# Patient Record
Sex: Female | Born: 1970 | Race: White | Hispanic: No | Marital: Married | Smoking: Current every day smoker
Health system: Southern US, Community
[De-identification: ages and names within clinical notes are randomized; demographics above are authoritative.]

## PROBLEM LIST (undated history)

## (undated) DIAGNOSIS — R011 Cardiac murmur, unspecified: Secondary | ICD-10-CM

## (undated) DIAGNOSIS — J45909 Unspecified asthma, uncomplicated: Secondary | ICD-10-CM

## (undated) DIAGNOSIS — J449 Chronic obstructive pulmonary disease, unspecified: Secondary | ICD-10-CM

## (undated) DIAGNOSIS — F419 Anxiety disorder, unspecified: Secondary | ICD-10-CM

## (undated) DIAGNOSIS — T783XXA Angioneurotic edema, initial encounter: Secondary | ICD-10-CM

## (undated) HISTORY — DX: Unspecified asthma, uncomplicated: J45.909

## (undated) HISTORY — DX: Anxiety disorder, unspecified: F41.9

## (undated) HISTORY — DX: Cardiac murmur, unspecified: R01.1

## (undated) HISTORY — PX: TONSILLECTOMY: SUR1361

## (undated) HISTORY — DX: Angioneurotic edema, initial encounter: T78.3XXA

## (undated) HISTORY — PX: SINOSCOPY: SHX187

## (undated) HISTORY — DX: Chronic obstructive pulmonary disease, unspecified: J44.9

---
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
## 1993-12-02 HISTORY — PX: TUBAL LIGATION: SHX77

## 2001-12-02 HISTORY — PX: ADENOIDECTOMY: SUR15

## 2004-06-13 ENCOUNTER — Emergency Department (HOSPITAL_COMMUNITY): Admission: EM | Admit: 2004-06-13 | Discharge: 2004-06-13 | Payer: Self-pay | Admitting: Emergency Medicine
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
## 2008-12-02 HISTORY — PX: BUNIONECTOMY: SHX129

## 2009-12-02 HISTORY — PX: VAGINAL HYSTERECTOMY: SHX2639

## 2013-12-02 HISTORY — PX: SHOULDER SURGERY: SHX246

## 2013-12-09 DIAGNOSIS — M25319 Other instability, unspecified shoulder: Secondary | ICD-10-CM | POA: Insufficient documentation

## 2014-02-28 DIAGNOSIS — S43439A Superior glenoid labrum lesion of unspecified shoulder, initial encounter: Secondary | ICD-10-CM | POA: Insufficient documentation

## 2014-04-28 DIAGNOSIS — M7520 Bicipital tendinitis, unspecified shoulder: Secondary | ICD-10-CM | POA: Insufficient documentation

## 2014-10-17 DIAGNOSIS — R200 Anesthesia of skin: Secondary | ICD-10-CM | POA: Insufficient documentation

## 2014-10-17 DIAGNOSIS — R42 Dizziness and giddiness: Secondary | ICD-10-CM | POA: Insufficient documentation

## 2014-10-17 DIAGNOSIS — M47812 Spondylosis without myelopathy or radiculopathy, cervical region: Secondary | ICD-10-CM | POA: Insufficient documentation

## 2014-11-01 DIAGNOSIS — G43909 Migraine, unspecified, not intractable, without status migrainosus: Secondary | ICD-10-CM | POA: Insufficient documentation

## 2014-11-01 DIAGNOSIS — R202 Paresthesia of skin: Secondary | ICD-10-CM | POA: Insufficient documentation

## 2014-11-01 DIAGNOSIS — F419 Anxiety disorder, unspecified: Secondary | ICD-10-CM | POA: Insufficient documentation

## 2014-11-15 DIAGNOSIS — R799 Abnormal finding of blood chemistry, unspecified: Secondary | ICD-10-CM | POA: Insufficient documentation

## 2014-11-15 DIAGNOSIS — M542 Cervicalgia: Secondary | ICD-10-CM | POA: Insufficient documentation

## 2016-07-16 ENCOUNTER — Ambulatory Visit (INDEPENDENT_AMBULATORY_CARE_PROVIDER_SITE_OTHER): Payer: Medicare Other | Admitting: Allergy

## 2016-07-16 ENCOUNTER — Encounter: Payer: Self-pay | Admitting: Allergy

## 2016-07-16 VITALS — BP 110/70 | HR 100 | Temp 98.2°F | Resp 18 | Ht 59.0 in | Wt 102.8 lb

## 2016-07-16 DIAGNOSIS — T783XXA Angioneurotic edema, initial encounter: Secondary | ICD-10-CM | POA: Diagnosis not present

## 2016-07-16 DIAGNOSIS — Z889 Allergy status to unspecified drugs, medicaments and biological substances status: Secondary | ICD-10-CM | POA: Diagnosis not present

## 2016-07-16 DIAGNOSIS — K209 Esophagitis, unspecified without bleeding: Secondary | ICD-10-CM | POA: Insufficient documentation

## 2016-07-16 DIAGNOSIS — R0602 Shortness of breath: Secondary | ICD-10-CM

## 2016-07-16 DIAGNOSIS — J439 Emphysema, unspecified: Secondary | ICD-10-CM | POA: Insufficient documentation

## 2016-07-16 NOTE — Patient Instructions (Addendum)
1. Angioedema, initial encounter Recurrent episodes of swelling with shortness of breath and rash concerning for allergic reaction.  Recent reaction followed after sucrolose ingestion of which you report an allergy to.  We do not have skin testing for this and thus would avoid as you have been doing previously.   Given recurrent episodes of allergic type reaction concern for possible mast cell activation.   Obtain following labs: CBC w diff, CMP, Tryptase, C4 level, environmental aeroallergen panel with total IgE   Continue taking Zantac 150mg  twice a day and would recommend starting Allegra 180mg  daily.   Pending lab results, may benefit from trial of cromolyn for abdominal symptoms.    2. Shortness of breath Occurs during reaction as above.  Continue avoidance of drugs/ingredients you are allergic too Use Proair as needed and monitor frequency of use.   3. Drug allergy Continue avoidance for now of medications you are allergic to. May be candidate in the future for drug allergy testing/challenges.   4. Esophagitis Concerned for possible eosinophilic esophagitis given long-standing history of choking and feeling of food stuck in throat.  Will assess eosinophil level with CBC as above.  Follow-up with GI for upper endoscopy  Follow-up 2mo

## 2016-07-16 NOTE — Progress Notes (Signed)
New Patient Note  RE: Sara Everett MRN: [id number] DOB: [birth year] Date of Office Visit: 07/16/2016  Referring provider: Marcellus Scott, MD Primary care provider: Mikael Spray, NP  Chief Complaint: swelling, SOB, flushing  History of present illness: Sara Everett is a 45 y.o. female presenting today for consultation for swelling, SOB, flushing following sucrolose ingestion.    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  Afterwards she noted sucrolose was main ingredient.  She states she went to sleep and her husband woke her up in the night because  her face, abdomen, feet, hands were swollen.  She took a benadryl and she went to [facility].  She reports she was also having SOB.  At the hospital she reports she was given prendisone and dicyclomine.   Several days later she went to visit her grandchild who was in [facility] hospital however while walking into the hospital she felt like her swelling was worsening and was having SOB again and felt like she couldn't walk. She reports the ED there told her she might have been reacting to the presnidone or dicyclomin thus now she list these as allergies.  She received benadryl there.  She then saw her PCP who prescribed hydroxzyine to help with both antihistamine effect and to help with her anxiety and she was also advised to take Zantac.  She reports the swelling took about a week to fully resolve.   She reports abdominal swelling after she eats and has occasional cramping.  She had an episode 4 years ago when she developed rash, swelling and SOB after visiting zoo.  She also reports choking with foods and sensation of food being stuck in throat for past 20years.   She recently had a colonoscopy and had one polyp removed.  She reports having hot flashes but states she is going thru menopause.   She does not note episodic flushing and neither does husband.  Denies headaches.  Does report episodic  rash described as cluster of red bumps that are itchy; she currently reports she has rash on chest.  She also reports occasional syncopal episodes. And she has episodes where she reports she doesn't feel well and will have a tingling sensation through her body.    She does has panic attacks and feels this could be causing her SOB when she has a reaction.   Has been stung by venomous insect years ago with no systemic reactions.    She is a smoker and has been diagnosed with COPD years ago and follows with Dr. Blenda Nicely.   She does have a proair which she uses when she has illness otherwise reports use if very infrequent.    She reports she was a patient of ours and saw [place] in our office about 10 years ago and had allergy testing and she reports she was only positive to cockroach.    She list a variety of drug allergies as below.   Review of systems: Review of Systems  Constitutional: Negative for fever.  HENT: Negative for sore throat.   Eyes: Negative for redness.  Respiratory: Negative for cough and wheezing.   Cardiovascular: Positive for palpitations.  Gastrointestinal: Positive for abdominal pain.  Skin: Positive for rash.  Neurological: Negative for headaches.    All other systems negative unless noted above in HPI  Past medical history: Past Medical History:  Diagnosis Date  . Angio-edema   . Anxiety disorder   .  Asthma   . COPD (chronic obstructive pulmonary disease) (HCC)   . Heart murmur 1973    Past surgical history: Past Surgical History:  Procedure Laterality Date  . ADENOIDECTOMY  2003  . BUNIONECTOMY Left 2010  . SHOULDER SURGERY  2015  . SINOSCOPY  2011,2010,2010   deviated septum  . TONSILLECTOMY    . TUBAL LIGATION  1995  . VAGINAL HYSTERECTOMY  2011    Family history:  Family History  Problem Relation Age of Onset  . Cancer Mother   . Cancer Father   . Leukemia Sister   . High blood pressure Brother   . Gout Brother   . Breast cancer Paternal  Grandmother   . Melanoma Sister   . Diabetes Sister   . High blood pressure Sister   . Seizures Sister   No family history of anaphylaxis, angioedema   Social history: Social History   Social History  . Marital status: Married    Social History Main Topics  . Smoking status: Current Every Day Smoker    Packs/day: 1.00    Years: 29.00    Types: Cigarettes  . Smokeless tobacco: Never Used  . Alcohol use No  . Drug use: No    Social History Narrative  . Lives with husband in house with carpeting.   Cats inside home.  Cats and dog outside and is currently caring for a racoon.  Not-employed.     Medication List:   Medication List       Accurate as of 07/16/16  4:37 PM. Always use your most recent med list.          DULoxetine 30 MG capsule Commonly known as:  CYMBALTA Take 30 mg by mouth daily.   EPIPEN 2-PAK 0.3 mg/0.3 mL Soaj injection Generic drug:  EPINEPHrine Inject into the muscle once.   fluticasone 50 MCG/ACT nasal spray Commonly known as:  FLONASE Place 1 spray into both nostrils daily.   hydrOXYzine 25 MG tablet Commonly known as:  ATARAX/VISTARIL Take 25 mg by mouth 3 (three) times daily as needed.   PROAIR HFA 108 (90 Base) MCG/ACT inhaler Generic drug:  albuterol Inhale 2 puffs into the lungs every 4 (four) hours as needed for wheezing or shortness of breath.       Known medication allergies: Allergies  Allergen Reactions  . Antiseptic Mouth Rinse Anaphylaxis and Other (See Comments)    Fluid retention  . Penicillins Itching and Anaphylaxis    Make throat feel like it's closing  . Povidone-Iodine Swelling  . Aspirin Other (See Comments)    Unsure  . Dicyclomine Swelling    Stomach swelling  . Fluoxetine Other (See Comments)  . Gabapentin Other (See Comments)    "makes me crazy"  . Other Swelling    Artificial sweeteners. Also reports rash  . Prednisone Swelling    Stomach swelling  . Promethazine Other (See Comments)    "drawn up  leg cramps"  . Sertraline Other (See Comments)  . Sulfur   . Varenicline Other (See Comments)  . Cefaclor Rash  . Codeine Rash  . Erythromycin Rash  . Erythromycin Base Rash    itching  . Naproxen Sodium Rash  . Paroxetine Hcl Rash  . Sulfamethoxazole Rash     Physical examination: Blood pressure 110/70, pulse 100, temperature 98.2 F (36.8 C), temperature source Oral, resp. rate 18, height 4\' 11"  (1.499 m), weight 102 lb 12.8 oz (46.6 kg).  General: Alert, interactive, in no acute distress.  HEENT: TMs pearly gray, turbinates minimally edematous without discharge, post-pharynx non erythematous. Neck: Supple without lymphadenopathy. Lungs: Clear to auscultation without wheezing, rhonchi or rales. {no increased work of breathing. CV: Normal S1, S2 without murmurs. Abdomen: Nondistended, nontender. Skin: several erythematous blanchable macules over chest.  numerous hypopigmented macules over chest, arms.  many tattooes. Extremities:  No clubbing, cyanosis or edema. Neuro:   Grossly intact.  Diagnositics/Labs: Reviewed OV from NP McRae dated 06/17/16 which start she was given epipen[facility] prednisone and [facility]16 for facial swelling.  She returned 2 days later for same facial swelling and was given doxycycline.  She was then [facility] the following [facility] and given dexamethasone and hydroxyzine.  Follow-up with NP McRae with plan to [facility] and referred to allergy.    Assessment and plan:   1. Angioedema, initial encounter Recurrent episodes of swelling with shortness of breath and rash concerning for allergic-type reaction.  Recent reaction followed after sucrolose ingestion of which she reports an allergy to.  We do not have availability to perform skin or IgE testing to sucrolose. She should continue avoidance.   Given recurrent episodes of allergic-type reaction concerned for possible mast cell activation or systemic mastocytosis and  will begin evaluation as follows: CBC w diff, CMP, Tryptase, C4 level, environmental aeroallergen panel with total IgE   Continue taking Zantac 150mg  twice a day and would recommend starting Allegra 180mg  daily.  If she continue to have these recurrent reactions would increase to Allegra BID.   She has Epipen already.     Pending lab results, may benefit from trial of cromolyn for her abdominal symptoms.    2. Shortness of breath Reports occurs only during reactions as above.  Continue avoidance of drugs/ingredients you are allergic to.   Use Proair as needed and monitor frequency of use.  Continue follow-up with Dr. Blenda Nicelyhodri in pulmonology  3. Drug allergy Continue avoidance for now of medications you are allergic to. May be candidate in the future for drug allergy testing/challenges.   4. Esophagitis Concerned for possible eosinophilic esophagitis given long-standing history of choking and feeling of food stuck in throat.  Will assess eosinophil level with CBC as above.  Follow-up with GI and recommend upper endoscopy with biopsies to assess for EoE.  Follow-up 74mo  I appreciate the opportunity to take part in Sherilee's care. Please do not hesitate to contact me with questions.  Sincerely,   Margo AyeShaylar Denisa Enterline, MD Allergy/Immunology [facility]

## 2016-07-19 LAB — ALLERGENS W/TOTAL IGE AREA 2
Alternaria Alternata IgE: <0.1 kU/L
Aspergillus Fumigatus IgE: <0.1 kU/L
Bermuda Grass IgE: <0.1 kU/L
Cedar, Mountain IgE: <0.1 kU/L
Common Silver Birch IgE: <0.1 kU/L
D Farinae IgE: <0.1 kU/L
D Pteronyssinus IgE: <0.1 kU/L
IGE (IMMUNOGLOBULIN E), SERUM: 14 [IU]/mL (ref 0–100)
Maple/Box Elder IgE: <0.1 kU/L
Mouse Urine IgE: <0.1 kU/L
Oak, White IgE: <0.1 kU/L
Pigweed, Rough IgE: <0.1 kU/L
Ragweed, Short IgE: <0.1 kU/L
Sheep Sorrel IgE Qn: <0.1 kU/L

## 2016-07-19 LAB — COMPREHENSIVE METABOLIC PANEL
ALBUMIN: 4.5 g/dL (ref 3.5–5.5)
ALK PHOS: 77 IU/L (ref 39–117)
ALT: 8 IU/L (ref 0–32)
AST: 13 IU/L (ref 0–40)
Albumin/Globulin Ratio: 2 (ref 1.2–2.2)
BUN / CREAT RATIO: 22 (ref 9–23)
BUN: 8 mg/dL (ref 6–24)
Bilirubin Total: 0.2 mg/dL (ref 0.0–1.2)
CO2: 27 mmol/L (ref 18–29)
CREATININE: 0.36 mg/dL — AB (ref 0.57–1.00)
Calcium: 9.2 mg/dL (ref 8.7–10.2)
Chloride: 101 mmol/L (ref 96–106)
GFR calc Af Amer: 152 mL/min/{1.73_m2} (ref >59)
GFR calc non Af Amer: 132 mL/min/{1.73_m2} (ref >59)
GLUCOSE: 85 mg/dL (ref 65–99)
Globulin, Total: 2.2 g/dL (ref 1.5–4.5)
Potassium: 4 mmol/L (ref 3.5–5.2)
Sodium: 142 mmol/L (ref 134–144)
Total Protein: 6.7 g/dL (ref 6.0–8.5)

## 2016-07-19 LAB — CBC WITH DIFFERENTIAL/PLATELET
Basophils Absolute: 0 10*3/uL (ref 0.0–0.2)
Basos: 0 %
EOS (ABSOLUTE): 0.1 10*3/uL (ref 0.0–0.4)
EOS: 1 %
HEMATOCRIT: 40.2 % (ref 34.0–46.6)
Hemoglobin: 13 g/dL (ref 11.1–15.9)
IMMATURE GRANULOCYTES: 0 %
Immature Grans (Abs): 0 10*3/uL (ref 0.0–0.1)
Lymphocytes Absolute: 2.9 10*3/uL (ref 0.7–3.1)
Lymphs: 39 %
MCH: 31.6 pg (ref 26.6–33.0)
MCHC: 32.3 g/dL (ref 31.5–35.7)
MCV: 98 fL — ABNORMAL HIGH (ref 79–97)
MONOCYTES: 10 %
MONOS ABS: 0.7 10*3/uL (ref 0.1–0.9)
NEUTROS PCT: 50 %
Neutrophils Absolute: 3.8 10*3/uL (ref 1.4–7.0)
Platelets: 263 10*3/uL (ref 150–379)
RBC: 4.12 x10E6/uL (ref 3.77–5.28)
RDW: 12.5 % (ref 12.3–15.4)
WBC: 7.5 10*3/uL (ref 3.4–10.8)

## 2016-07-19 LAB — TRYPTASE: Tryptase: 5.4 ug/L (ref 2.2–13.2)

## 2016-07-19 LAB — C4 COMPLEMENT: COMPLEMENT C4, SERUM: 20 mg/dL (ref 14–44)

## 2019-10-04 ENCOUNTER — Encounter: Payer: Self-pay | Admitting: Gastroenterology

## 2020-03-01 DIAGNOSIS — R000 Tachycardia, unspecified: Secondary | ICD-10-CM | POA: Insufficient documentation

## 2020-03-01 DIAGNOSIS — R9431 Abnormal electrocardiogram [ECG] [EKG]: Secondary | ICD-10-CM | POA: Insufficient documentation

## 2020-05-11 ENCOUNTER — Ambulatory Visit (INDEPENDENT_AMBULATORY_CARE_PROVIDER_SITE_OTHER): Payer: Medicare HMO | Admitting: Sports Medicine

## 2020-05-11 ENCOUNTER — Ambulatory Visit (INDEPENDENT_AMBULATORY_CARE_PROVIDER_SITE_OTHER): Payer: Medicare HMO

## 2020-05-11 ENCOUNTER — Encounter: Payer: Self-pay | Admitting: Sports Medicine

## 2020-05-11 ENCOUNTER — Other Ambulatory Visit: Payer: Self-pay

## 2020-05-11 DIAGNOSIS — B351 Tinea unguium: Secondary | ICD-10-CM

## 2020-05-11 DIAGNOSIS — M79674 Pain in right toe(s): Secondary | ICD-10-CM | POA: Diagnosis not present

## 2020-05-11 DIAGNOSIS — M21619 Bunion of unspecified foot: Secondary | ICD-10-CM

## 2020-05-11 DIAGNOSIS — M21612 Bunion of left foot: Secondary | ICD-10-CM | POA: Diagnosis not present

## 2020-05-11 DIAGNOSIS — M79675 Pain in left toe(s): Secondary | ICD-10-CM

## 2020-05-11 NOTE — Progress Notes (Signed)
Subjective: Adrien Dietzman is a 49 y.o. female patient seen today in office with complaint of mildly painful thickened and discolored nails. Patient is desiring treatment for nail changes; has tried OTC topicals/Medication in the past with no improvement. Reports that nails are becoming difficult to manage because of the thickness.Reports that her bunion that she had surgery 11 years ago and the screw she can feel sometimes red and swollen. Patient has no other pedal complaints at this time.   Review of Systems  All other systems reviewed and are negative.    Patient Active Problem List   Diagnosis Date Noted  . Abnormal Holter monitor finding 03/01/2020  . Sinus tachycardia 03/01/2020  . COPD (chronic obstructive pulmonary disease) with emphysema ([place]) 07/16/2016  . Angioedema 07/16/2016  . Drug allergy 07/16/2016  . Esophagitis 07/16/2016  . Abnormal blood chemistry test 11/15/2014  . Neck pain 11/15/2014  . Anxiety 11/01/2014  . Migraine 11/01/2014  . Paresthesia of both hands 11/01/2014  . Cervical spondylosis 10/17/2014  . Dizziness 10/17/2014  . Numbness of left hand 10/17/2014  . Biceps tendinitis 04/28/2014  . SLAP tear of shoulder 02/28/2014  . Shoulder instability 12/09/2013    Current Outpatient Medications on File Prior to Visit  Medication Sig Dispense Refill  . albuterol (PROAIR HFA) 108 (90 Base) MCG/ACT inhaler Inhale 2 puffs into the lungs every 4 (four) hours as needed for wheezing or shortness of breath.    . DULoxetine (CYMBALTA) 30 MG capsule Take 30 mg by mouth daily.    Marland Kitchen EPINEPHrine (EPIPEN 2-PAK) 0.3 mg/0.3 mL IJ SOAJ injection Inject into the muscle once.    . fluticasone (FLONASE) 50 MCG/ACT nasal spray Place 1 spray into both nostrils daily.    . hydrOXYzine (ATARAX/VISTARIL) 25 MG tablet Take 25 mg by mouth 3 (three) times daily as needed.     No current facility-administered medications on file prior to visit.    Allergies  Allergen Reactions  .  Antiseptic Mouth Rinse Anaphylaxis and Other (See Comments)    Fluid retention  . Penicillins Itching and Anaphylaxis    Make throat feel like it's closing  . Povidone-Iodine Swelling  . Aspirin Other (See Comments)    Unsure  . Dicyclomine Swelling    Stomach swelling  . Fluoxetine Other (See Comments)  . Gabapentin Other (See Comments)    "makes me crazy"  . Other Swelling    Artificial sweeteners. Also reports rash  . Prednisone Swelling    Stomach swelling  . Promethazine Other (See Comments)    "drawn up leg cramps"  . Sertraline Other (See Comments)  . Sulfur   . Varenicline Other (See Comments)  . Cefaclor Rash  . Codeine Rash  . Erythromycin Rash  . Erythromycin Base Rash    itching  . Naproxen Sodium Rash  . Paroxetine Hcl Rash  . Sulfamethoxazole Rash    Objective: Physical Exam  General: Well developed, nourished, no acute distress, awake, alert and oriented x 3  Vascular: Dorsalis pedis artery 2/4 bilateral, Posterior tibial artery 1/4 bilateral, skin temperature warm to warm proximal to distal bilateral lower extremities, no varicosities, pedal hair present bilateral.  Neurological: Gross sensation present via light touch bilateral.   Dermatological: Skin is warm, dry, and supple bilateral, Nails 1-10 are tender, short thick, and discolored with mild subungal debris, no webspace macerations present bilateral, no open lesions present bilateral, Old surgical scars well healed on left, no callus/corns/hyperkeratotic tissue present bilateral. No signs of infection bilateral.  Musculoskeletal: Bunion R>L boney deformities noted bilateral. Minimal pain to prominent screw head at left 1st met. Muscular strength within normal limits without painon range of motion. No pain with calf compression bilateral.  Assessment and Plan:  Problem List Items Addressed This Visit    None    Visit Diagnoses    Nail fungus    -  Primary   Relevant Orders   Culture, fungus  without smear   Bunion       Relevant Orders   DG Foot Complete Left   Toe pain, bilateral          -Examined patient -Discussed bunion hardware with patient -Patient at this time elects to treat her toe nails first before considering removal of hardware on the left -Discussed treatment options for painful dystrophic nails  -Clinical picture and Fungal culture was obtained by removing a portion of the hard nail itself from each of the involved toenails using a sterile nail nipper and sent to [facility] lab. Patient tolerated the biopsy procedure well without discomfort or need for anesthesia.  -Patient to return in 4 weeks for follow up evaluation and discussion of fungal culture results or sooner if symptoms worsen.  Landis Martins, DPM

## 2020-05-31 ENCOUNTER — Other Ambulatory Visit: Payer: Self-pay

## 2020-05-31 ENCOUNTER — Telehealth (INDEPENDENT_AMBULATORY_CARE_PROVIDER_SITE_OTHER): Payer: Medicare HMO | Admitting: Sports Medicine

## 2020-05-31 ENCOUNTER — Encounter: Payer: Self-pay | Admitting: Sports Medicine

## 2020-05-31 DIAGNOSIS — M79674 Pain in right toe(s): Secondary | ICD-10-CM

## 2020-05-31 DIAGNOSIS — B351 Tinea unguium: Secondary | ICD-10-CM

## 2020-05-31 DIAGNOSIS — M79675 Pain in left toe(s): Secondary | ICD-10-CM

## 2020-05-31 MED ORDER — TERBINAFINE HCL 250 MG PO TABS: 250.0000 mg | ORAL_TABLET | Freq: Every day | ORAL | 0 refills | Status: AC

## 2020-05-31 NOTE — Progress Notes (Signed)
Virtual Visit via Telephone Note  I connected with Veora Fonte on 05/31/20 at  5:00 PM EDT by telephone and verified that I am speaking with the correct person using two identifiers.  Location: Patient: Sara Everett  Provider: Asencion Islam, DPM    I discussed the limitations, risks, security and privacy concerns of performing an evaluation and management service by telephone and the availability of in person appointments. I also discussed with the patient that there may be a patient responsible charge related to this service. The patient expressed understanding and agreed to proceed.   History of Present Illness:  Telephone visit for fungal culture results  Observations/Objective: Subjectively patient reports that toenails are the same no acute changes no redness warmth swelling or drainage no physical exam unable to be performed due to the telephone nature of this visit  Assessment and Plan: Onychomycosis with traumatic nail changes as supported by [place] fungal culture results -Discussed with patient treatment options for nail fungus -Patient elects at this time to try oral Lamisil advised patient to wait to after her endoscopic procedure in August to start Lamisil I sent medication to her pharmacy for her to pick up and wait to start after her endoscopic procedure as well as advised patient that she will need to make a follow-up after she starts the medicine for follow-up blood work and medication check; discussed the risk and alternatives with patient even in the setting of her multiple allergy history and medication sensitivities and advised patient that if she is concerned about this interaction with all of her allergies to discuss with pharmacists prior to picking up -Advised patient to also consider laser treatment as well as topical antifungal.  Patient reports that she wants to think about these additional treatments and will call me if she changes her mind about the oral Lamisil  however at this time requested I continue with sending the medication as above -Advised patient to wear shoes that do not rub toe and to decrease excessive pressure to her toes to prevent worsening of traumatic changes to nail as well -Encouraged good hygiene habits  Follow Up Instructions: Return after starting oral Lamisil for medication check and blood work   I discussed the assessment and treatment plan with the patient. The patient was provided an opportunity to ask questions and all were answered. The patient agreed with the plan and demonstrated an understanding of the instructions.   The patient was advised to call back or seek an in-person evaluation if the symptoms worsen or if the condition fails to improve as anticipated.  I provided 16 minutes of non-face-to-face time during this encounter.   Asencion Islam, DPM
